# Patient Record
Sex: Male | Born: 1940 | Race: White | Hispanic: No | Marital: Single | Smoking: Former smoker
Health system: Southern US, Community
[De-identification: ages and names within clinical notes are randomized; demographics above are authoritative.]

## PROBLEM LIST (undated history)

## (undated) DIAGNOSIS — M1990 Unspecified osteoarthritis, unspecified site: Secondary | ICD-10-CM

## (undated) DIAGNOSIS — I2510 Atherosclerotic heart disease of native coronary artery without angina pectoris: Secondary | ICD-10-CM

## (undated) DIAGNOSIS — N400 Enlarged prostate without lower urinary tract symptoms: Secondary | ICD-10-CM

## (undated) DIAGNOSIS — I219 Acute myocardial infarction, unspecified: Secondary | ICD-10-CM

## (undated) DIAGNOSIS — R42 Dizziness and giddiness: Secondary | ICD-10-CM

## (undated) DIAGNOSIS — I209 Angina pectoris, unspecified: Secondary | ICD-10-CM

## (undated) DIAGNOSIS — I739 Peripheral vascular disease, unspecified: Secondary | ICD-10-CM

## (undated) DIAGNOSIS — E119 Type 2 diabetes mellitus without complications: Secondary | ICD-10-CM

## (undated) DIAGNOSIS — I10 Essential (primary) hypertension: Secondary | ICD-10-CM

## (undated) DIAGNOSIS — E7800 Pure hypercholesterolemia, unspecified: Secondary | ICD-10-CM

## (undated) DIAGNOSIS — K219 Gastro-esophageal reflux disease without esophagitis: Secondary | ICD-10-CM

## (undated) DIAGNOSIS — G4730 Sleep apnea, unspecified: Secondary | ICD-10-CM

## (undated) HISTORY — PX: CORONARY ARTERY BYPASS GRAFT: SHX141

## (undated) HISTORY — PX: OTHER SURGICAL HISTORY: SHX169

## (undated) HISTORY — PX: APPENDECTOMY: SHX54

## (undated) HISTORY — PX: CORONARY ANGIOPLASTY: SHX604

---

## 2015-03-01 ENCOUNTER — Other Ambulatory Visit: Payer: Self-pay | Admitting: Podiatry

## 2015-03-04 ENCOUNTER — Ambulatory Visit (HOSPITAL_COMMUNITY)
Admission: EM | Admit: 2015-03-04 | Discharge: 2015-03-04 | Disposition: A | Discharge status: Home / Self Care | Payer: Medicare Other | Source: Ambulatory Visit | Attending: Podiatry | Admitting: Podiatry

## 2015-03-04 ENCOUNTER — Encounter (HOSPITAL_COMMUNITY): Payer: Self-pay

## 2015-03-04 ENCOUNTER — Ambulatory Visit (HOSPITAL_COMMUNITY): Payer: Medicare Other

## 2015-03-04 ENCOUNTER — Encounter (HOSPITAL_COMMUNITY)
Admission: EM | Disposition: A | Discharge status: Home / Self Care | Payer: Self-pay | Source: Ambulatory Visit | Attending: Podiatry

## 2015-03-04 ENCOUNTER — Ambulatory Visit (HOSPITAL_COMMUNITY): Payer: Medicare Other | Admitting: Anesthesiology

## 2015-03-04 DIAGNOSIS — M868X7 Other osteomyelitis, ankle and foot: Secondary | ICD-10-CM | POA: Diagnosis not present

## 2015-03-04 DIAGNOSIS — M86172 Other acute osteomyelitis, left ankle and foot: Secondary | ICD-10-CM

## 2015-03-04 DIAGNOSIS — Z9889 Other specified postprocedural states: Secondary | ICD-10-CM

## 2015-03-04 DIAGNOSIS — M869 Osteomyelitis, unspecified: Secondary | ICD-10-CM

## 2015-03-04 HISTORY — DX: Type 2 diabetes mellitus without complications: E11.9

## 2015-03-04 HISTORY — DX: Peripheral vascular disease, unspecified: I73.9

## 2015-03-04 HISTORY — DX: Atherosclerotic heart disease of native coronary artery without angina pectoris: I25.10

## 2015-03-04 HISTORY — PX: AMPUTATION: SHX166

## 2015-03-04 HISTORY — DX: Acute myocardial infarction, unspecified: I21.9

## 2015-03-04 HISTORY — DX: Unspecified osteoarthritis, unspecified site: M19.90

## 2015-03-04 HISTORY — DX: Angina pectoris, unspecified: I20.9

## 2015-03-04 HISTORY — DX: Gastro-esophageal reflux disease without esophagitis: K21.9

## 2015-03-04 HISTORY — DX: Essential (primary) hypertension: I10

## 2015-03-04 LAB — CBC
HCT: 47.1 % (ref 39.0–52.0)
Hemoglobin: 16.3 g/dL (ref 13.0–17.0)
MCH: 31 pg (ref 26.0–34.0)
MCHC: 34.6 g/dL (ref 30.0–36.0)
MCV: 89.7 fL (ref 78.0–100.0)
PLATELETS: 341 10*3/uL (ref 150–400)
RBC: 5.25 MIL/uL (ref 4.22–5.81)
RDW: 12.9 % (ref 11.5–15.5)
WBC: 14 10*3/uL — AB (ref 4.0–10.5)

## 2015-03-04 LAB — BASIC METABOLIC PANEL
Anion gap: 9 (ref 5–15)
BUN: 16 mg/dL (ref 6–23)
CO2: 27 mmol/L (ref 19–32)
CREATININE: 0.75 mg/dL (ref 0.50–1.35)
Calcium: 9.1 mg/dL (ref 8.4–10.5)
Chloride: 103 mmol/L (ref 96–112)
GFR calc Af Amer: >90 mL/min (ref >90)
GFR calc non Af Amer: >90 mL/min (ref >90)
Glucose, Bld: 96 mg/dL (ref 70–99)
Potassium: 3.6 mmol/L (ref 3.5–5.1)
SODIUM: 139 mmol/L (ref 135–145)

## 2015-03-04 LAB — GLUCOSE, CAPILLARY
GLUCOSE-CAPILLARY: 71 mg/dL (ref 70–99)
GLUCOSE-CAPILLARY: 70 mg/dL (ref 70–99)
Glucose-Capillary: 78 mg/dL (ref 70–99)
Glucose-Capillary: 226 mg/dL — ABNORMAL HIGH (ref 70–99)
Glucose-Capillary: 253 mg/dL — ABNORMAL HIGH (ref 70–99)

## 2015-03-04 SURGERY — AMPUTATION DIGIT
Anesthesia: Monitor Anesthesia Care | Laterality: Left

## 2015-03-04 MED ORDER — LIDOCAINE HCL (PF) 1 % IJ SOLN
INTRAMUSCULAR | Status: AC
  Filled 2015-03-04: qty 30

## 2015-03-04 MED ORDER — MIDAZOLAM HCL 2 MG/2ML IJ SOLN
INTRAMUSCULAR | Status: AC
  Filled 2015-03-04: qty 2

## 2015-03-04 MED ORDER — DEXTROSE 50 % IV SOLN
12.5000 g | Freq: Once | INTRAVENOUS | Status: AC
  Administered 2015-03-04: 12.5 g via INTRAVENOUS

## 2015-03-04 MED ORDER — LIDOCAINE HCL (CARDIAC) 10 MG/ML IV SOLN
INTRAVENOUS | Status: DC | PRN
  Administered 2015-03-04: 50 mg via INTRAVENOUS

## 2015-03-04 MED ORDER — ONDANSETRON HCL 4 MG/2ML IJ SOLN: 4.0000 mg | Freq: Once | INTRAMUSCULAR | Status: AC | PRN

## 2015-03-04 MED ORDER — GLYCOPYRROLATE 0.2 MG/ML IJ SOLN
INTRAMUSCULAR | Status: AC
  Filled 2015-03-04: qty 1

## 2015-03-04 MED ORDER — LACTATED RINGERS IV SOLN
INTRAVENOUS | Status: DC
  Administered 2015-03-04: 11:00:00 via INTRAVENOUS

## 2015-03-04 MED ORDER — DEXTROSE 50 % IV SOLN
INTRAVENOUS | Status: DC | PRN
  Administered 2015-03-04: 25 g via INTRAVENOUS

## 2015-03-04 MED ORDER — PROPOFOL INFUSION 10 MG/ML OPTIME
INTRAVENOUS | Status: DC | PRN
  Administered 2015-03-04: 75 ug/kg/min via INTRAVENOUS

## 2015-03-04 MED ORDER — FENTANYL CITRATE 0.05 MG/ML IJ SOLN
INTRAMUSCULAR | Status: AC
  Filled 2015-03-04: qty 2

## 2015-03-04 MED ORDER — FENTANYL CITRATE 0.05 MG/ML IJ SOLN
INTRAMUSCULAR | Status: DC | PRN
  Administered 2015-03-04 (×4): 25 ug via INTRAVENOUS

## 2015-03-04 MED ORDER — SODIUM CHLORIDE 0.9 % IV SOLN
INTRAVENOUS | Status: DC | PRN
  Administered 2015-03-04: 12:00:00 via INTRAVENOUS

## 2015-03-04 MED ORDER — PROPOFOL 10 MG/ML IV BOLUS
INTRAVENOUS | Status: AC
  Filled 2015-03-04: qty 20

## 2015-03-04 MED ORDER — 0.9 % SODIUM CHLORIDE (POUR BTL) OPTIME
TOPICAL | Status: DC | PRN
  Administered 2015-03-04: 1000 mL

## 2015-03-04 MED ORDER — FENTANYL CITRATE 0.05 MG/ML IJ SOLN: 25.0000 ug | INTRAMUSCULAR | Status: DC | PRN

## 2015-03-04 MED ORDER — BUPIVACAINE HCL (PF) 0.5 % IJ SOLN
INTRAMUSCULAR | Status: DC | PRN
  Administered 2015-03-04: 10 mL

## 2015-03-04 MED ORDER — VANCOMYCIN HCL IN DEXTROSE 1-5 GM/200ML-% IV SOLN
INTRAVENOUS | Status: AC
  Filled 2015-03-04: qty 400

## 2015-03-04 MED ORDER — BUPIVACAINE HCL (PF) 0.5 % IJ SOLN
INTRAMUSCULAR | Status: AC
  Filled 2015-03-04: qty 30

## 2015-03-04 MED ORDER — GLYCOPYRROLATE 0.2 MG/ML IJ SOLN
INTRAMUSCULAR | Status: DC | PRN
  Administered 2015-03-04: 0.2 mg via INTRAVENOUS

## 2015-03-04 MED ORDER — MIDAZOLAM HCL 2 MG/2ML IJ SOLN
1.0000 mg | INTRAMUSCULAR | Status: DC | PRN
  Administered 2015-03-04: 2 mg via INTRAVENOUS

## 2015-03-04 MED ORDER — LIDOCAINE HCL (PF) 1 % IJ SOLN
INTRAMUSCULAR | Status: AC
  Filled 2015-03-04: qty 20

## 2015-03-04 MED ORDER — DEXTROSE 50 % IV SOLN
INTRAVENOUS | Status: AC
  Filled 2015-03-04: qty 50

## 2015-03-04 MED ORDER — MIDAZOLAM HCL 5 MG/5ML IJ SOLN
INTRAMUSCULAR | Status: DC | PRN
  Administered 2015-03-04 (×2): 1 mg via INTRAVENOUS

## 2015-03-04 MED ORDER — LACTATED RINGERS IV SOLN
INTRAVENOUS | Status: DC | PRN
  Administered 2015-03-04: 09:00:00 via INTRAVENOUS

## 2015-03-04 MED ORDER — SODIUM CHLORIDE 0.9 % IV SOLN
2000.0000 mg | Freq: Once | INTRAVENOUS | Status: AC
  Administered 2015-03-04: 2000 mg via INTRAVENOUS
  Filled 2015-03-04: qty 2000

## 2015-03-04 MED ORDER — FENTANYL CITRATE 0.05 MG/ML IJ SOLN
25.0000 ug | INTRAMUSCULAR | Status: AC
  Administered 2015-03-04 (×2): 25 ug via INTRAVENOUS

## 2015-03-04 SURGICAL SUPPLY — 42 items
BAG HAMPER (MISCELLANEOUS) ×2 IMPLANT
BANDAGE CONFORM 2  STR LF (GAUZE/BANDAGES/DRESSINGS) ×2 IMPLANT
BANDAGE ELASTIC 4 VELCRO NS (GAUZE/BANDAGES/DRESSINGS) ×2 IMPLANT
BANDAGE ESMARK 4X12 BL STRL LF (DISPOSABLE) ×1 IMPLANT
BANDAGE GAUZE ELAST BULKY 4 IN (GAUZE/BANDAGES/DRESSINGS) ×2 IMPLANT
BENZOIN TINCTURE PRP APPL 2/3 (GAUZE/BANDAGES/DRESSINGS) ×2 IMPLANT
BLADE 15 SAFETY STRL DISP (BLADE) ×4 IMPLANT
BLADE AVERAGE 25X9 (BLADE) IMPLANT
BNDG CONFORM 2 STRL LF (GAUZE/BANDAGES/DRESSINGS) ×2 IMPLANT
BNDG ESMARK 4X12 BLUE STRL LF (DISPOSABLE) ×2
BNDG GAUZE ELAST 4 BULKY (GAUZE/BANDAGES/DRESSINGS) ×2 IMPLANT
CLOTH BEACON ORANGE TIMEOUT ST (SAFETY) ×2 IMPLANT
COVER LIGHT HANDLE STERIS (MISCELLANEOUS) ×4 IMPLANT
CUFF TOURNIQUET SINGLE 18IN (TOURNIQUET CUFF) ×2 IMPLANT
DECANTER SPIKE VIAL GLASS SM (MISCELLANEOUS) ×2 IMPLANT
DRSG ADAPTIC 3X8 NADH LF (GAUZE/BANDAGES/DRESSINGS) ×2 IMPLANT
ELECT REM PT RETURN 9FT ADLT (ELECTROSURGICAL) ×2
ELECTRODE REM PT RTRN 9FT ADLT (ELECTROSURGICAL) ×1 IMPLANT
GAUZE SPONGE 4X4 12PLY STRL (GAUZE/BANDAGES/DRESSINGS) ×2 IMPLANT
GLOVE BIO SURGEON STRL SZ7.5 (GLOVE) ×4 IMPLANT
GLOVE BIOGEL PI IND STRL 7.0 (GLOVE) ×2 IMPLANT
GLOVE BIOGEL PI IND STRL 7.5 (GLOVE) ×1 IMPLANT
GLOVE BIOGEL PI INDICATOR 7.0 (GLOVE) ×2
GLOVE BIOGEL PI INDICATOR 7.5 (GLOVE) ×1
GLOVE ECLIPSE 6.5 STRL STRAW (GLOVE) ×2 IMPLANT
GOWN STRL REUS W/TWL LRG LVL3 (GOWN DISPOSABLE) ×6 IMPLANT
KIT ROOM TURNOVER APOR (KITS) ×2 IMPLANT
MANIFOLD NEPTUNE II (INSTRUMENTS) ×2 IMPLANT
NEEDLE HYPO 27GX1-1/4 (NEEDLE) ×4 IMPLANT
NS IRRIG 1000ML POUR BTL (IV SOLUTION) ×2 IMPLANT
PACK BASIC LIMB (CUSTOM PROCEDURE TRAY) ×2 IMPLANT
PAD ARMBOARD 7.5X6 YLW CONV (MISCELLANEOUS) ×2 IMPLANT
RASP SM TEAR CROSS CUT (RASP) IMPLANT
SET BASIN LINEN APH (SET/KITS/TRAYS/PACK) ×2 IMPLANT
SOL PREP PROV IODINE SCRUB 4OZ (MISCELLANEOUS) ×2 IMPLANT
SPONGE GAUZE 4X4 12PLY (GAUZE/BANDAGES/DRESSINGS) ×2 IMPLANT
SPONGE LAP 18X18 X RAY DECT (DISPOSABLE) ×2 IMPLANT
STRIP CLOSURE SKIN 1/2X4 (GAUZE/BANDAGES/DRESSINGS) ×2 IMPLANT
SUT ETHILON 4 0 PS 2 18 (SUTURE) IMPLANT
SUT PROLENE 4 0 PS 2 18 (SUTURE) ×4 IMPLANT
SUT VIC AB 4-0 PS2 27 (SUTURE) IMPLANT
SYRINGE 10CC LL (SYRINGE) ×4 IMPLANT

## 2015-03-04 NOTE — Anesthesia Postprocedure Evaluation (Signed)
  Anesthesia Post-op Note  Patient: Jeremiah Diaz  Procedure(s) Performed: Procedure(s): AMPUTATION DIGIT 3RD TOE LEFT FOOT (Left)  Patient Location: PACU  Anesthesia Type:MAC  Level of Consciousness: awake and alert   Airway and Oxygen Therapy: Patient Spontanous Breathing  Post-op Pain: mild  Post-op Assessment: Post-op Vital signs reviewed, Patient's Cardiovascular Status Stable, Respiratory Function Stable, Patent Airway, No signs of Nausea or vomiting and Adequate PO intake  Post-op Vital Signs: Reviewed and stable  Last Vitals:  Filed Vitals:   03/04/15 1400  BP: 141/67  Pulse: 76  Temp:   Resp: 13    Complications: Blood Gluocse 70 and treated with D 50. Will recheck Blood Glucose prior to discharge.

## 2015-03-04 NOTE — Addendum Note (Signed)
Addendum  created 03/04/15 1436 by Franco Noneseresa S Tomesha Sargent, CRNA   Modules edited: Anesthesia Events, Anesthesia Medication Administration, Narrator, Notes Section   Narrator:  Narrator: Event Log Edited   Notes Section:  File: [id number]

## 2015-03-04 NOTE — H&P (Signed)
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  The various methods of treatment have been discussed with the patient.  No guarantees were given.  After consideration of risks, benefits and other options for treatment, the patient has consented to surgery.  I have reviewed the patients' chart and labs.    Patient Vitals for the past 24 hrs:  BP Temp src Pulse Resp SpO2 Height Weight  03/04/15 1115 137/74 mmHg - - (!) 0 95 % - -  03/04/15 1110 (!) 161/91 mmHg - - 19 99 % - -  03/04/15 1105 (!) 151/75 mmHg - - 15 99 % - -  03/04/15 1100 (!) 162/90 mmHg - - 12 98 % - -  03/04/15 1055 (!) 162/90 mmHg - - (!) 22 100 % - -  03/04/15 1050 (!) 151/83 mmHg - - (!) 8 100 % - -  03/04/15 1045 (!) 155/82 mmHg - - 11 (!) 78 % - -  03/04/15 1040 (!) 150/85 mmHg - - 18 99 % - -  03/04/15 1035 (!) 150/80 mmHg - - 19 99 % - -  03/04/15 1030 (!) 148/79 mmHg - - (!) 8 99 % - -  03/04/15 1025 (!) 149/74 mmHg - - 10 (!) 84 % - -  03/04/15 1020 (!) 146/73 mmHg - - 15 100 % - -  03/04/15 1015 (!) 154/81 mmHg - - (!) 0 100 % - -  03/04/15 1010 (!) 145/69 mmHg - - (!) 0 97 % - -  03/04/15 1005 (!) 145/75 mmHg - - (!) 0 98 % - -  03/04/15 1000 (!) 152/80 mmHg - - 12 99 % - -  03/04/15 0955 (!) 150/78 mmHg - - (!) 9 98 % - -  03/04/15 0950 (!) 156/77 mmHg - - 17 98 % - -  03/04/15 0945 (!) 157/77 mmHg - - 13 99 % - -  03/04/15 0944 (!) 157/77 mmHg - - (!) 0 100 % - -  03/04/15 0943 (!) 157/77 mmHg - - 15 100 % - -  03/04/15 0942 (!) 150/86 mmHg - - 12 99 % - -  03/04/15 0941 (!) 150/86 mmHg - - - - - -  03/04/15 0940 - - - - 100 % - -  03/04/15 [id number] - Oral 78 11 - 5\' 9"  (1.753 m) 195 lb (88.451 kg)    A history and physical examination was performed in my office.  The patient was reexamined.  There have been no changes to this history and physical examination.  Dallas SchimkeBenjamin Ivan Kazimir Hartnett,  DPM

## 2015-03-04 NOTE — Anesthesia Procedure Notes (Signed)
Procedure Name: MAC Date/Time: 03/04/2015 12:15 PM Performed by: Pernell DupreADAMS, Fairley Copher A Pre-anesthesia Checklist: Patient identified, Timeout performed, Emergency Drugs available, Suction available and Patient being monitored Oxygen Delivery Method: Simple face mask

## 2015-03-04 NOTE — Anesthesia Postprocedure Evaluation (Signed)
  Anesthesia Post-op Note  Patient: Jeremiah Diaz  Procedure(s) Performed: Procedure(s): AMPUTATION DIGIT 3RD TOE LEFT FOOT (Left)  Patient Location: PACU  Anesthesia Type:MAC  Level of Consciousness: awake, alert , oriented and patient cooperative  Airway and Oxygen Therapy: Patient Spontanous Breathing and Patient connected to nasal cannula oxygen  Post-op Pain: none  Post-op Assessment: Post-op Vital signs reviewed, Patient's Cardiovascular Status Stable, Respiratory Function Stable, Patent Airway, No signs of Nausea or vomiting, Pain level controlled and No headache  Post-op Vital Signs: Reviewed and stable  Last Vitals:  Filed Vitals:   03/04/15 1210  BP: 143/77  Pulse:   Temp:   Resp: 13    Complications: No apparent anesthesia complications

## 2015-03-04 NOTE — Discharge Instructions (Signed)
These instructions will give you an idea of what to expect after surgery and how to manage issues that may arise before your first post op office visit.  Pain Management Pain is best managed by staying ahead of it. If pain gets out of control, it is difficult to get it back under control. Local anesthesia that lasts 6-8 hours is used to numb the foot and decrease pain.  For the best pain control, take the pain medication as instructed for the first 2 days post op. On the third day pain medication can be taken as needed.   Post Op Nausea Nausea is common after surgery, so it is managed proactively.  If prescribed, use the prescribed nausea medication regularly for the first 2 days post op.  Bandages Do not worry if there is blood on the bandage. What looks like a lot of blood on the bandage is actually a small amount. Blood on the dressing spreads out as it is absorbed by the gauze, the same way a drop of water spreads out on a paper towel.  If the bandages feel wet or dry, stiff and uncomfortable, call the office during office hours and we will schedule a time for you to have the bandage changed.  Unless you are specifically told otherwise, we will do the first bandage change in the office.  Keep your bandage dry. If the bandage becomes wet or soiled, notify the office and we will schedule a time to change the bandage.  Activity It is best to spend most of the first 2 days after surgery lying down with the foot elevated above the level of your heart. You may put weight on your left foot while wearing the post-operative shoe.   You may only get up to go to the restroom.  Driving Do not drive until you are able to respond in an emergency (i.e. slam on the brakes). This usually occurs after the bone has healed - 6 to 8 weeks.  Call the Office If you have a fever over 101F.  If you have increasing pain after the initial post op pain has settled down.  If you have increasing redness,  swelling, or drainage.  If you have any questions or concerns.

## 2015-03-04 NOTE — Transfer of Care (Signed)
Immediate Anesthesia Transfer of Care Note  Patient: Jeremiah Diaz  Procedure(s) Performed: Procedure(s): AMPUTATION DIGIT 3RD TOE LEFT FOOT (Left)  Patient Location: PACU  Anesthesia Type:MAC  Level of Consciousness: awake, alert , oriented and patient cooperative  Airway & Oxygen Therapy: Patient Spontanous Breathing and Patient connected to nasal cannula oxygen  Post-op Assessment: Report given to RN and Post -op Vital signs reviewed and stable  Post vital signs: Reviewed and stable  Last Vitals:  Filed Vitals:   03/04/15 1210  BP: 143/77  Pulse:   Temp:   Resp: 13    Complications: No apparent anesthesia complications

## 2015-03-04 NOTE — Op Note (Signed)
OPERATIVE NOTE  DATE OF PROCEDURE:  03/04/2015  SURGEON:  Dallas SchimkeBenjamin Ivan Sherriann Szuch, DPM  OR STAFF:   Circulator: Lennox Pippinsebra P Dallas, RN; Galen Manilaanya J Smith, RN Relief Scrub: Donald Poreeborah A Blackwell, CST Scrub Person: Horatio PelMaggie P Henderson, CST Circulator Assistant: Lizabeth LeydenBetty M Ashley, RN.   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  POSTOPERATIVE DIAGNOSIS:  Same.  PROCEDURE:  Amputation of third toe of left foot.  ANESTHESIA:  Monitor Anesthesia Care.   HEMOSTASIS:  Pneumatic ankle tourniquet set at 250 mmHg.  ESTIMATED BLOOD LOSS:  Minimal (<5 cc).  MATERIALS USED:  None.  INJECTABLES:  0.5% Marcaine plain.  PATHOLOGY:  Third toe of left foot.  COMPLICATIONS:  None.  INDICATIONS:  Nonhealing ulceration of the third toe of the left foot with erosive changes of the distal phalanx on plain film radiography..  DESCRIPTION OF THE PROCEDURE:  The patient was brought to the operating room and placed on the operative table in the supine position.  A pneumatic ankle tourniquet was applied to the patient's ankle.  Following sedation, the surgical site was anesthetized with 0.5% Marcaine plain.  The foot was then prepped, scrubbed, and draped in the usual sterile technique.  The foot was elevated, exsanguinated and the pneumatic ankle tourniquet inflated to 250 mmHg.    Attention was directed to the third toe of the left foot.  2 converging semielliptical incisions were performed encompassing the toes circumferentially.  Dissection was continued deep down to level of the third metatarsophalangeal joint.  The joint was disarticulated.  The third toe was passed from the operative field and sent to pathology for evaluation.  The head of the third metatarsal bone was evaluated.  No erosive changes were noted.  The surgical field was irrigated with copious amounts of sterile irrigant.  The skin was reapproximated using 4-0 Prolene in a simple suture technique.  A sterile compressive dressing was  applied to the left foot.  The pneumatic ankle tourniquet was deflated and a prompt hyperemic response was noted to all remaining digits of the left foot.   The patient tolerated the procedure well.  The patient was then transferred to PACU with vital signs stable and vascular status intact to all toes of the operative foot.  Following a period of postoperative monitoring, the patient will be discharged home.

## 2015-03-04 NOTE — Anesthesia Preprocedure Evaluation (Addendum)
Anesthesia Evaluation  Patient identified by MRN, date of birth, ID band Patient awake    Reviewed: Allergy & Precautions, NPO status , Patient's Chart, lab work & pertinent test results  Airway Mallampati: II  TM Distance: >3 FB     Dental  (+) Edentulous Lower, Edentulous Upper   Pulmonary shortness of breath and with exertion, former smoker,  breath sounds clear to auscultation        Cardiovascular hypertension, Pt. on medications and Pt. on home beta blockers + angina with exertion + CAD, + Past MI, + CABG, + Peripheral Vascular Disease and + DOE Rhythm:Regular Rate:Normal     Neuro/Psych    GI/Hepatic GERD-  Controlled and Medicated,  Endo/Other  diabetes, Well Controlled, Type 2, Insulin Dependent  Renal/GU      Musculoskeletal   Abdominal   Peds  Hematology   Anesthesia Other Findings   Reproductive/Obstetrics                            Anesthesia Physical Anesthesia Plan  ASA: III  Anesthesia Plan: MAC   Post-op Pain Management:    Induction: Intravenous  Airway Management Planned: Simple Face Mask  Additional Equipment:   Intra-op Plan:   Post-operative Plan:   Informed Consent: I have reviewed the patients History and Physical, chart, labs and discussed the procedure including the risks, benefits and alternatives for the proposed anesthesia with the patient or authorized representative who has indicated his/her understanding and acceptance.     Plan Discussed with:   Anesthesia Plan Comments: (1/2 amp D50W for cbg=78 in preop. )       Anesthesia Quick Evaluation

## 2015-03-06 ENCOUNTER — Encounter (HOSPITAL_COMMUNITY): Payer: Self-pay | Admitting: Podiatry

## 2015-04-30 ENCOUNTER — Other Ambulatory Visit: Payer: Self-pay | Admitting: Podiatry

## 2015-04-30 NOTE — Patient Instructions (Signed)
    Jeremiah Diaz  04/30/2015    Your procedure is scheduled on 05/02/15.  [facility]  Call this number if you have problems the morning of surgery:  [phone]   Remember:  Do not eat food or drink liquids after midnight.  Take these medicines the morning of surgery with A SIP OF WATER: Enalapril, Prevacid and Metoprolol. Take only half of your dose of Novolin 70/30 insulin the night before your procedure.   Do not wear jewelry, make-up or nail polish.  Do not wear lotions, powders, or perfumes.  You may wear deodorant.  Do not shave 48 hours prior to surgery.  Men may shave face and neck.  Do not bring valuables to the hospital.  [facility] not [facility] for any belongings or valuables.  Contacts, dentures or bridgework may not be worn into surgery.  Leave your suitcase in the car.  After surgery it may be brought to your room.  For patients admitted to the hospital, discharge time will be determined by your treatment team.  Patients discharged the day of surgery will not be allowed to drive home.    Special instructions:  Shower with Hibiclens (CHG bath) the night before your surgery and the morning of surgery.  Please read over the following fact sheets that you were given. Anesthesia Post-op Instructions    PATIENT INSTRUCTIONS POST-ANESTHESIA  IMMEDIATELY FOLLOWING SURGERY:  Do not drive or operate machinery for the first twenty four hours after surgery.  Do not make any important decisions for twenty four hours after surgery or while taking narcotic pain medications or sedatives.  If you develop intractable nausea and vomiting or a severe headache please notify your doctor immediately.  FOLLOW-UP:  Please make an appointment with your surgeon as instructed. You do not need to follow up with anesthesia unless specifically instructed to do so.  WOUND CARE INSTRUCTIONS (if applicable):  Keep a dry clean dressing on the anesthesia/puncture  wound site if there is drainage.  Once the wound has quit draining you may leave it open to air.  Generally you should leave the bandage intact for twenty four hours unless there is drainage.  If the epidural site drains for more than 36-48 hours please call the anesthesia department.  QUESTIONS?:  Please feel free to call your physician or the hospital operator if you have any questions, and they will be happy to assist you.

## 2015-05-01 ENCOUNTER — Encounter (HOSPITAL_COMMUNITY)
Admission: RE | Admit: 2015-05-01 | Discharge: 2015-05-01 | Disposition: A | Discharge status: Home / Self Care | Payer: Medicare Other | Source: Ambulatory Visit | Attending: Podiatry | Admitting: Podiatry

## 2015-05-01 ENCOUNTER — Ambulatory Visit (HOSPITAL_COMMUNITY)
Admission: RE | Admit: 2015-05-01 | Discharge: 2015-05-01 | Disposition: A | Discharge status: Home / Self Care | Payer: Medicare Other | Source: Ambulatory Visit | Attending: Podiatry | Admitting: Podiatry

## 2015-05-01 ENCOUNTER — Encounter (HOSPITAL_COMMUNITY): Payer: Self-pay

## 2015-05-01 DIAGNOSIS — Z89422 Acquired absence of other left toe(s): Secondary | ICD-10-CM | POA: Diagnosis not present

## 2015-05-01 DIAGNOSIS — X58XXXA Exposure to other specified factors, initial encounter: Secondary | ICD-10-CM | POA: Diagnosis not present

## 2015-05-01 DIAGNOSIS — M7989 Other specified soft tissue disorders: Secondary | ICD-10-CM | POA: Insufficient documentation

## 2015-05-01 DIAGNOSIS — M86172 Other acute osteomyelitis, left ankle and foot: Secondary | ICD-10-CM

## 2015-05-01 DIAGNOSIS — L97529 Non-pressure chronic ulcer of other part of left foot with unspecified severity: Secondary | ICD-10-CM | POA: Insufficient documentation

## 2015-05-01 DIAGNOSIS — M869 Osteomyelitis, unspecified: Secondary | ICD-10-CM | POA: Insufficient documentation

## 2015-05-01 DIAGNOSIS — T8189XA Other complications of procedures, not elsewhere classified, initial encounter: Secondary | ICD-10-CM | POA: Insufficient documentation

## 2015-05-01 HISTORY — DX: Sleep apnea, unspecified: G47.30

## 2015-05-01 HISTORY — DX: Dizziness and giddiness: R42

## 2015-05-01 HISTORY — DX: Benign prostatic hyperplasia without lower urinary tract symptoms: N40.0

## 2015-05-01 HISTORY — DX: Pure hypercholesterolemia, unspecified: E78.00

## 2015-05-01 LAB — CBC WITH DIFFERENTIAL/PLATELET
BASOS PCT: 0 % (ref 0–1)
Basophils Absolute: 0.1 10*3/uL (ref 0.0–0.1)
EOS PCT: 1 % (ref 0–5)
Eosinophils Absolute: 0.2 10*3/uL (ref 0.0–0.7)
HCT: 44.1 % (ref 39.0–52.0)
Hemoglobin: 14.8 g/dL (ref 13.0–17.0)
LYMPHS ABS: 2.5 10*3/uL (ref 0.7–4.0)
Lymphocytes Relative: 22 % (ref 12–46)
MCH: 30.1 pg (ref 26.0–34.0)
MCHC: 33.6 g/dL (ref 30.0–36.0)
MCV: 89.8 fL (ref 78.0–100.0)
MONO ABS: 0.9 10*3/uL (ref 0.1–1.0)
MONOS PCT: 8 % (ref 3–12)
NEUTROS ABS: 8.1 10*3/uL — AB (ref 1.7–7.7)
Neutrophils Relative %: 69 % (ref 43–77)
Platelets: 375 10*3/uL (ref 150–400)
RBC: 4.91 MIL/uL (ref 4.22–5.81)
RDW: 12.7 % (ref 11.5–15.5)
WBC: 11.8 10*3/uL — ABNORMAL HIGH (ref 4.0–10.5)

## 2015-05-01 LAB — BASIC METABOLIC PANEL
Anion gap: 7 (ref 5–15)
BUN: 18 mg/dL (ref 6–20)
CO2: 26 mmol/L (ref 22–32)
CREATININE: 0.92 mg/dL (ref 0.61–1.24)
Calcium: 9.1 mg/dL (ref 8.9–10.3)
Chloride: 101 mmol/L (ref 101–111)
GFR calc Af Amer: >60 mL/min (ref >60)
GFR calc non Af Amer: >60 mL/min (ref >60)
Glucose, Bld: 234 mg/dL — ABNORMAL HIGH (ref 65–99)
Potassium: 5.5 mmol/L — ABNORMAL HIGH (ref 3.5–5.1)
Sodium: 134 mmol/L — ABNORMAL LOW (ref 135–145)

## 2015-05-01 MED ORDER — CYCLOPENTOLATE-PHENYLEPHRINE OP SOLN OPTIME - NO CHARGE
OPHTHALMIC | Status: AC
  Filled 2015-05-01: qty 2

## 2015-05-01 MED ORDER — PHENYLEPHRINE HCL 2.5 % OP SOLN
OPHTHALMIC | Status: AC
  Filled 2015-05-01: qty 15

## 2015-05-01 MED ORDER — TETRACAINE HCL 0.5 % OP SOLN
OPHTHALMIC | Status: AC
  Filled 2015-05-01: qty 2

## 2015-05-01 MED ORDER — LIDOCAINE HCL (PF) 1 % IJ SOLN
INTRAMUSCULAR | Status: AC
  Filled 2015-05-01: qty 2

## 2015-05-01 MED ORDER — NEOMYCIN-POLYMYXIN-DEXAMETH 3.5-10000-0.1 OP SUSP
OPHTHALMIC | Status: AC
  Filled 2015-05-01: qty 5

## 2015-05-01 MED ORDER — LIDOCAINE HCL 3.5 % OP GEL
OPHTHALMIC | Status: AC
  Filled 2015-05-01: qty 1

## 2015-05-01 NOTE — Progress Notes (Signed)
   05/01/15 1428  OBSTRUCTIVE SLEEP APNEA  Have you ever been diagnosed with sleep apnea through a sleep study? No  Do you snore loudly (loud enough to be heard through closed doors)?  0  Do you often feel tired, fatigued, or sleepy during the daytime? 0  Has anyone observed you stop breathing during your sleep? 0  Do you have, or are you being treated for high blood pressure? 1  BMI more than 35 kg/m2? 0  Age over 72 years old? 1  Neck circumference greater than 40 cm/16 inches? 1  Gender: 1  Obstructive Sleep Apnea Score 4

## 2015-05-01 NOTE — Pre-Procedure Instructions (Signed)
Patient in for PAT prior to surgery tomorrow 05/02/2015. States he has not stopped his plavix, and took it today. Dr Jayme CloudGonzalez notified and wants Dr Reynolds BowlLu notified. Dr Reynolds BowlLu notified that patient has not stopped his plavix and she states he does not need to hold it for surgery. No further orders given.

## 2015-05-02 ENCOUNTER — Encounter (HOSPITAL_COMMUNITY): Payer: Self-pay | Admitting: *Deleted

## 2015-05-02 ENCOUNTER — Ambulatory Visit (HOSPITAL_COMMUNITY): Payer: Medicare Other | Admitting: Anesthesiology

## 2015-05-02 ENCOUNTER — Encounter (HOSPITAL_COMMUNITY)
Admission: RE | Disposition: A | Discharge status: Home / Self Care | Payer: Self-pay | Source: Ambulatory Visit | Attending: Podiatry

## 2015-05-02 ENCOUNTER — Ambulatory Visit (HOSPITAL_COMMUNITY)
Admission: RE | Admit: 2015-05-02 | Discharge: 2015-05-02 | Disposition: A | Discharge status: Home / Self Care | Payer: Medicare Other | Source: Ambulatory Visit | Attending: Podiatry | Admitting: Podiatry

## 2015-05-02 DIAGNOSIS — M868X7 Other osteomyelitis, ankle and foot: Secondary | ICD-10-CM | POA: Diagnosis not present

## 2015-05-02 DIAGNOSIS — Z951 Presence of aortocoronary bypass graft: Secondary | ICD-10-CM | POA: Insufficient documentation

## 2015-05-02 DIAGNOSIS — G4730 Sleep apnea, unspecified: Secondary | ICD-10-CM | POA: Diagnosis not present

## 2015-05-02 DIAGNOSIS — I10 Essential (primary) hypertension: Secondary | ICD-10-CM | POA: Insufficient documentation

## 2015-05-02 DIAGNOSIS — I252 Old myocardial infarction: Secondary | ICD-10-CM | POA: Diagnosis not present

## 2015-05-02 DIAGNOSIS — Z794 Long term (current) use of insulin: Secondary | ICD-10-CM | POA: Diagnosis not present

## 2015-05-02 DIAGNOSIS — K219 Gastro-esophageal reflux disease without esophagitis: Secondary | ICD-10-CM | POA: Diagnosis not present

## 2015-05-02 DIAGNOSIS — I25119 Atherosclerotic heart disease of native coronary artery with unspecified angina pectoris: Secondary | ICD-10-CM | POA: Insufficient documentation

## 2015-05-02 DIAGNOSIS — Z87891 Personal history of nicotine dependence: Secondary | ICD-10-CM | POA: Insufficient documentation

## 2015-05-02 DIAGNOSIS — I739 Peripheral vascular disease, unspecified: Secondary | ICD-10-CM | POA: Insufficient documentation

## 2015-05-02 DIAGNOSIS — E119 Type 2 diabetes mellitus without complications: Secondary | ICD-10-CM | POA: Diagnosis not present

## 2015-05-02 HISTORY — PX: AMPUTATION: SHX166

## 2015-05-02 LAB — GLUCOSE, CAPILLARY: Glucose-Capillary: 106 mg/dL — ABNORMAL HIGH (ref 65–99)

## 2015-05-02 SURGERY — AMPUTATION DIGIT
Anesthesia: Monitor Anesthesia Care | Site: Toe | Laterality: Left

## 2015-05-02 MED ORDER — PROPOFOL INFUSION 10 MG/ML OPTIME
INTRAVENOUS | Status: DC | PRN
  Administered 2015-05-02: 75 ug/kg/min via INTRAVENOUS

## 2015-05-02 MED ORDER — MIDAZOLAM HCL 5 MG/5ML IJ SOLN
INTRAMUSCULAR | Status: DC | PRN
  Administered 2015-05-02: 1 mg via INTRAVENOUS

## 2015-05-02 MED ORDER — SODIUM CHLORIDE 0.9 % IV SOLN
INTRAVENOUS | Status: DC | PRN
  Administered 2015-05-02: 11:00:00 via INTRAVENOUS

## 2015-05-02 MED ORDER — ONDANSETRON HCL 4 MG/2ML IJ SOLN: 4.0000 mg | Freq: Once | INTRAMUSCULAR | Status: DC | PRN

## 2015-05-02 MED ORDER — LACTATED RINGERS IV SOLN
INTRAVENOUS | Status: DC
  Administered 2015-05-02: 1000 mL via INTRAVENOUS

## 2015-05-02 MED ORDER — ONDANSETRON HCL 4 MG/2ML IJ SOLN
4.0000 mg | Freq: Once | INTRAMUSCULAR | Status: AC
  Administered 2015-05-02: 4 mg via INTRAVENOUS

## 2015-05-02 MED ORDER — FENTANYL CITRATE (PF) 100 MCG/2ML IJ SOLN
25.0000 ug | INTRAMUSCULAR | Status: AC
  Administered 2015-05-02 (×2): 25 ug via INTRAVENOUS

## 2015-05-02 MED ORDER — LIDOCAINE HCL 1 % IJ SOLN
INTRAMUSCULAR | Status: DC | PRN
  Administered 2015-05-02: 10 mL

## 2015-05-02 MED ORDER — FENTANYL CITRATE (PF) 100 MCG/2ML IJ SOLN
INTRAMUSCULAR | Status: AC
  Filled 2015-05-02: qty 2

## 2015-05-02 MED ORDER — VANCOMYCIN HCL IN DEXTROSE 1-5 GM/200ML-% IV SOLN
INTRAVENOUS | Status: AC
  Filled 2015-05-02: qty 200

## 2015-05-02 MED ORDER — LIDOCAINE HCL (PF) 1 % IJ SOLN
INTRAMUSCULAR | Status: AC
  Filled 2015-05-02: qty 30

## 2015-05-02 MED ORDER — VANCOMYCIN HCL 10 G IV SOLR
2000.0000 mg | Freq: Once | INTRAVENOUS | Status: AC
  Administered 2015-05-02: 2000 mg via INTRAVENOUS
  Filled 2015-05-02: qty 500

## 2015-05-02 MED ORDER — MIDAZOLAM HCL 2 MG/2ML IJ SOLN
1.0000 mg | INTRAMUSCULAR | Status: DC | PRN
  Administered 2015-05-02 (×2): 2 mg via INTRAVENOUS
  Filled 2015-05-02: qty 2

## 2015-05-02 MED ORDER — BUPIVACAINE HCL (PF) 0.25 % IJ SOLN
INTRAMUSCULAR | Status: AC
  Filled 2015-05-02: qty 30

## 2015-05-02 MED ORDER — FENTANYL CITRATE (PF) 100 MCG/2ML IJ SOLN: 25.0000 ug | INTRAMUSCULAR | Status: DC | PRN

## 2015-05-02 MED ORDER — MIDAZOLAM HCL 2 MG/2ML IJ SOLN
INTRAMUSCULAR | Status: AC
  Filled 2015-05-02: qty 2

## 2015-05-02 MED ORDER — ONDANSETRON HCL 4 MG/2ML IJ SOLN
INTRAMUSCULAR | Status: AC
  Filled 2015-05-02: qty 2

## 2015-05-02 MED ORDER — FENTANYL CITRATE (PF) 100 MCG/2ML IJ SOLN
INTRAMUSCULAR | Status: DC | PRN
  Administered 2015-05-02: 25 ug via INTRAVENOUS

## 2015-05-02 MED ORDER — PROPOFOL 10 MG/ML IV BOLUS
INTRAVENOUS | Status: AC
  Filled 2015-05-02: qty 20

## 2015-05-02 MED ORDER — 0.9 % SODIUM CHLORIDE (POUR BTL) OPTIME
TOPICAL | Status: DC | PRN
  Administered 2015-05-02: 800 mL

## 2015-05-02 SURGICAL SUPPLY — 42 items
BAG HAMPER (MISCELLANEOUS) ×3 IMPLANT
BANDAGE ELASTIC 4 VELCRO NS (GAUZE/BANDAGES/DRESSINGS) ×3 IMPLANT
BANDAGE ESMARK 4X12 BL STRL LF (DISPOSABLE) ×1 IMPLANT
BENZOIN TINCTURE PRP APPL 2/3 (GAUZE/BANDAGES/DRESSINGS) ×3 IMPLANT
BLADE SURG 15 STRL LF DISP TIS (BLADE) ×1 IMPLANT
BLADE SURG 15 STRL SS (BLADE) ×2
BNDG ESMARK 4X12 BLUE STRL LF (DISPOSABLE) ×3
BNDG GAUZE ELAST 4 BULKY (GAUZE/BANDAGES/DRESSINGS) ×3 IMPLANT
CLOSURE WOUND 1/4 X3 (GAUZE/BANDAGES/DRESSINGS) ×1
CLOTH BEACON ORANGE TIMEOUT ST (SAFETY) ×3 IMPLANT
COVER LIGHT HANDLE STERIS (MISCELLANEOUS) ×3 IMPLANT
CUFF TOURNIQUET SINGLE 18IN (TOURNIQUET CUFF) ×3 IMPLANT
DRAPE ORTHO 2.5IN SPLIT 77X108 (DRAPES) IMPLANT
DRAPE ORTHO SPLIT 77X108 STRL (DRAPES)
DRSG ADAPTIC 3X8 NADH LF (GAUZE/BANDAGES/DRESSINGS) ×3 IMPLANT
ELECT REM PT RETURN 9FT ADLT (ELECTROSURGICAL) ×3
ELECTRODE REM PT RTRN 9FT ADLT (ELECTROSURGICAL) ×1 IMPLANT
FORMALIN 10 PREFIL 120ML (MISCELLANEOUS) ×3 IMPLANT
GAUZE SPONGE 4X4 12PLY STRL (GAUZE/BANDAGES/DRESSINGS) ×2 IMPLANT
GLOVE BIO SURGEON STRL SZ 6 (GLOVE) ×3 IMPLANT
GLOVE BIOGEL PI IND STRL 6 (GLOVE) ×1 IMPLANT
GLOVE BIOGEL PI IND STRL 6.5 (GLOVE) ×1 IMPLANT
GLOVE BIOGEL PI INDICATOR 6 (GLOVE) ×2
GLOVE BIOGEL PI INDICATOR 6.5 (GLOVE) ×2
GLOVE ECLIPSE 6.5 STRL STRAW (GLOVE) ×3 IMPLANT
GLOVE INDICATOR 7.0 STRL GRN (GLOVE) ×6 IMPLANT
GOWN STRL REUS W/TWL LRG LVL3 (GOWN DISPOSABLE) ×3 IMPLANT
KIT ROOM TURNOVER APOR (KITS) ×3 IMPLANT
MANIFOLD NEPTUNE II (INSTRUMENTS) ×3 IMPLANT
NEEDLE HYPO 27GX1-1/4 (NEEDLE) ×6 IMPLANT
NS IRRIG 1000ML POUR BTL (IV SOLUTION) ×3 IMPLANT
PACK BASIC LIMB (CUSTOM PROCEDURE TRAY) ×3 IMPLANT
PAD ARMBOARD 7.5X6 YLW CONV (MISCELLANEOUS) ×3 IMPLANT
SET BASIN LINEN APH (SET/KITS/TRAYS/PACK) ×3 IMPLANT
SPONGE GAUZE 4X4 12PLY (GAUZE/BANDAGES/DRESSINGS) ×3 IMPLANT
STRIP CLOSURE SKIN 1/4X3 (GAUZE/BANDAGES/DRESSINGS) ×2 IMPLANT
SUT ETHILON 3 0 FSL (SUTURE) ×3 IMPLANT
SUT ETHILON 4 0 PS 2 18 (SUTURE) IMPLANT
SUT VIC AB 4-0 PS2 27 (SUTURE) IMPLANT
SUT VICRYL AB 3-0 FS1 BRD 27IN (SUTURE) IMPLANT
SYR CONTROL 10ML LL (SYRINGE) ×3 IMPLANT
TOWEL OR 17X26 4PK STRL BLUE (TOWEL DISPOSABLE) IMPLANT

## 2015-05-02 NOTE — Discharge Instructions (Signed)
Please keep dressing clean, dry, intact and do not get wet.    Ice and elevate surgical extremity on top of foot or behind the knee for 30 minutes on and 30 minutes off.  Non weight bear to left foot with aid of surgical shoe and walker.  Decrease weight bearing activity for only bathroom privileges.  Wear surgical shoe when going to the bathroom otherwise surgical shoe should be off if resting on chair/bed.      PATIENT INSTRUCTIONS POST-ANESTHESIA  IMMEDIATELY FOLLOWING SURGERY:  Do not drive or operate machinery for the first twenty four hours after surgery.  Do not make any important decisions for twenty four hours after surgery or while taking narcotic pain medications or sedatives.  If you develop intractable nausea and vomiting or a severe headache please notify your doctor immediately.  FOLLOW-UP:  Please make an appointment with your surgeon as instructed. You do not need to follow up with anesthesia unless specifically instructed to do so.  WOUND CARE INSTRUCTIONS (if applicable):  Keep a dry clean dressing on the anesthesia/puncture wound site if there is drainage.  Once the wound has quit draining you may leave it open to air.  Generally you should leave the bandage intact for twenty four hours unless there is drainage.  If the epidural site drains for more than 36-48 hours please call the anesthesia department.  QUESTIONS?:  Please feel free to call your physician or the hospital operator if you have any questions, and they will be happy to assist you.

## 2015-05-02 NOTE — Op Note (Signed)
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  A pneumatic ankle tourniquet was applied to the patient's ankle.  Following sedation, the surgical site was anesthetized with 10 mL of one-to-one mix of 1% lidocaine plain and 0.25% Marcaine plain.  The foot was then prepped, scrubbed, and draped in the usual sterile technique.  The foot was elevated, exsanguinated and the pneumatic ankle tourniquet inflated to 250 mmHg.    Attention was then directed to the left second toe where an ulceration is noted to the distal tip of the toe with the distal phalanx exposed. The left second toe skin was macerated along with purpuric discoloration and nonviable tissue with ulceration. A fishmouth incision was made from skin to bone with an attempt to preserve as much of the healthy skin tissue as possible. The left second digit was disarticulated at the metatarsophalangeal joint, removed the operative field, and sent to pathology. The second metatarsal head appears healthy and viable.  The wound was irrigated with copious amounts of normal sterile saline. The skin was then reapproximated using 3-0 nylon with vertical mattress and simple interrupted suture techniques. Steri-Strips were placed across the incision. The incision was then dressed with  Betadine soaked Adaptic, 4 x 4's, Kerlix, and Ace bandage. The pneumatic ankle tourniquet was then deflated and a prompt hyperemic response was noted to all remaining digits of the left foot. The patient tolerated the procedure and anesthesia well. He was transferred to the PACU with vital signs stable and vascular status at preoperative baseline to all remaining toes of the left foot. Following a period of postoperative monitoring the patient will be discharged home on written and oral postoperative instructions.

## 2015-05-02 NOTE — Anesthesia Postprocedure Evaluation (Signed)
  Anesthesia Post-op Note  Patient: Jeremiah Diaz  Procedure(s) Performed: Procedure(s): AMPUTATION DIGIT 2ND TOE LEFT FOOT (Left)  Patient Location: PACU  Anesthesia Type:MAC  Level of Consciousness: awake, alert , oriented and patient cooperative  Airway and Oxygen Therapy: Patient Spontanous Breathing  Post-op Pain: none  Post-op Assessment: Post-op Vital signs reviewed, Patient's Cardiovascular Status Stable, Respiratory Function Stable, Patent Airway, No signs of Nausea or vomiting and Pain level controlled  Post-op Vital Signs: Reviewed and stable  Last Vitals:  Filed Vitals:   05/02/15 1105  BP: 105/60  Temp:   Resp: 16    Complications: No apparent anesthesia complications

## 2015-05-02 NOTE — Anesthesia Preprocedure Evaluation (Signed)
Anesthesia Evaluation  Patient identified by MRN, date of birth, ID band Patient awake    Reviewed: Allergy & Precautions, NPO status , Patient's Chart, lab work & pertinent test results  Airway Mallampati: II  TM Distance: >3 FB     Dental  (+) Edentulous Lower, Edentulous Upper   Pulmonary shortness of breath and with exertion, sleep apnea , former smoker,  breath sounds clear to auscultation        Cardiovascular hypertension, Pt. on medications and Pt. on home beta blockers + angina with exertion + CAD, + Past MI, + CABG, + Peripheral Vascular Disease and + DOE Rhythm:Regular Rate:Normal     Neuro/Psych    GI/Hepatic GERD-  Controlled and Medicated,  Endo/Other  diabetes, Well Controlled, Type 2, Insulin Dependent  Renal/GU      Musculoskeletal   Abdominal   Peds  Hematology   Anesthesia Other Findings   Reproductive/Obstetrics                             Anesthesia Physical Anesthesia Plan  ASA: III  Anesthesia Plan: MAC   Post-op Pain Management:    Induction: Intravenous  Airway Management Planned: Simple Face Mask  Additional Equipment:   Intra-op Plan:   Post-operative Plan:   Informed Consent: I have reviewed the patients History and Physical, chart, labs and discussed the procedure including the risks, benefits and alternatives for the proposed anesthesia with the patient or authorized representative who has indicated his/her understanding and acceptance.     Plan Discussed with:   Anesthesia Plan Comments: (1/2 amp D50W for cbg=78 in preop. )        Anesthesia Quick Evaluation

## 2015-05-02 NOTE — Transfer of Care (Signed)
Immediate Anesthesia Transfer of Care Note  Patient: Jeremiah Diaz  Procedure(s) Performed: Procedure(s): AMPUTATION DIGIT 2ND TOE LEFT FOOT (Left)  Patient Location: PACU  Anesthesia Type:MAC  Level of Consciousness: awake, alert  and patient cooperative  Airway & Oxygen Therapy: Patient Spontanous Breathing and Patient connected to nasal cannula oxygen  Post-op Assessment: Report given to RN, Post -op Vital signs reviewed and stable and Patient moving all extremities  Post vital signs: Reviewed and stable  Last Vitals:  Filed Vitals:   05/02/15 1105  BP: 105/60  Temp:   Resp: 16    Complications: No apparent anesthesia complications

## 2015-05-02 NOTE — H&P (Signed)
HISTORY AND PHYSICAL INTERVAL NOTE:  05/02/2015  10:59 AM  Jeremiah Diaz  has presented today for surgery, with the diagnosis of osteomyelitis, for left 2nd toe amputation with possible wound VAC application.  The various methods of treatment have been discussed with the patient.  No guarantees were given.  After consideration of risks, benefits and other options for treatment, the patient has consented to surgery.  I have reviewed the patients' chart and labs.    Patient Vitals for the past 24 hrs:  BP Temp Temp src Resp SpO2 Height Weight  05/02/15 1025 129/70 mmHg - - 11 99 % - -  05/02/15 1020 123/65 mmHg - - 13 99 % - -  05/02/15 1015 137/76 mmHg 98.5 F (36.9 C) Oral (!) 28 100 % 5' 9.5" (1.765 m) 87.091 kg (192 lb)  05/02/15 1010 129/70 mmHg - - 19 99 % - -  05/02/15 1005 130/73 mmHg - - (!) 35 100 % - -    A history and physical examination was performed in my office.  The patient was reexamined.  There have been no changes to this history and physical examination.  Laurell JosephsSherry Aurielle Slingerland, DPM

## 2015-05-03 ENCOUNTER — Encounter (HOSPITAL_COMMUNITY): Payer: Self-pay | Admitting: Podiatry

## 2016-03-30 IMAGING — DX DG FOOT COMPLETE 3+V*L*
3 series · 3 of 3 positions shown · non-contrast
Comparison: Left foot MRI 04/26/2015 and radiographs 03/04/2015

CLINICAL DATA: Left foot osteomyelitis. New ulcer involving the
second toe 9 days ago. Third toe amputation 8 weeks ago.

EXAM:
LEFT FOOT - COMPLETE 3+ VIEW

[foot ap]
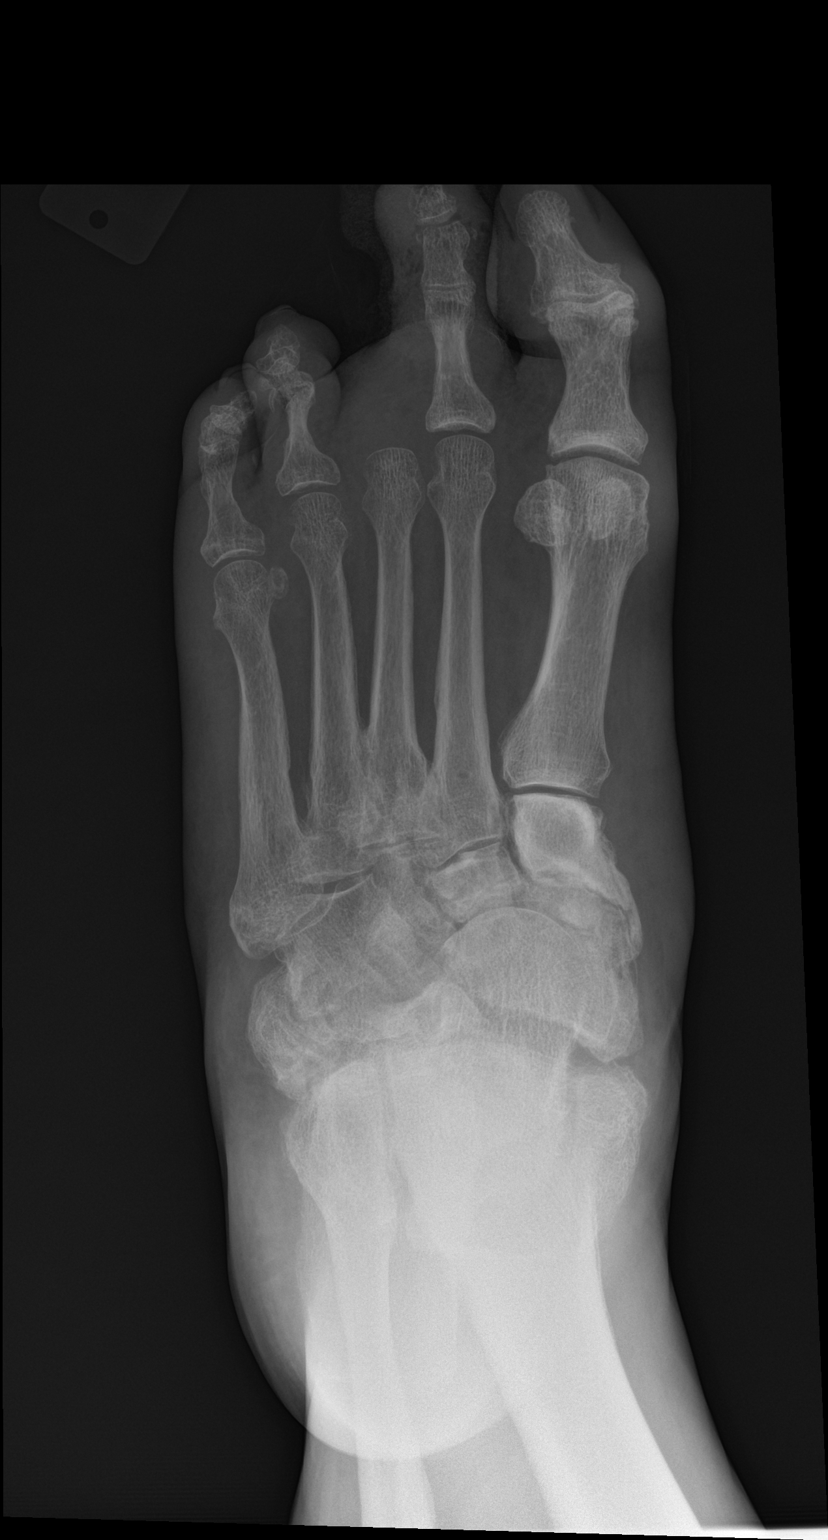

[foot obl]
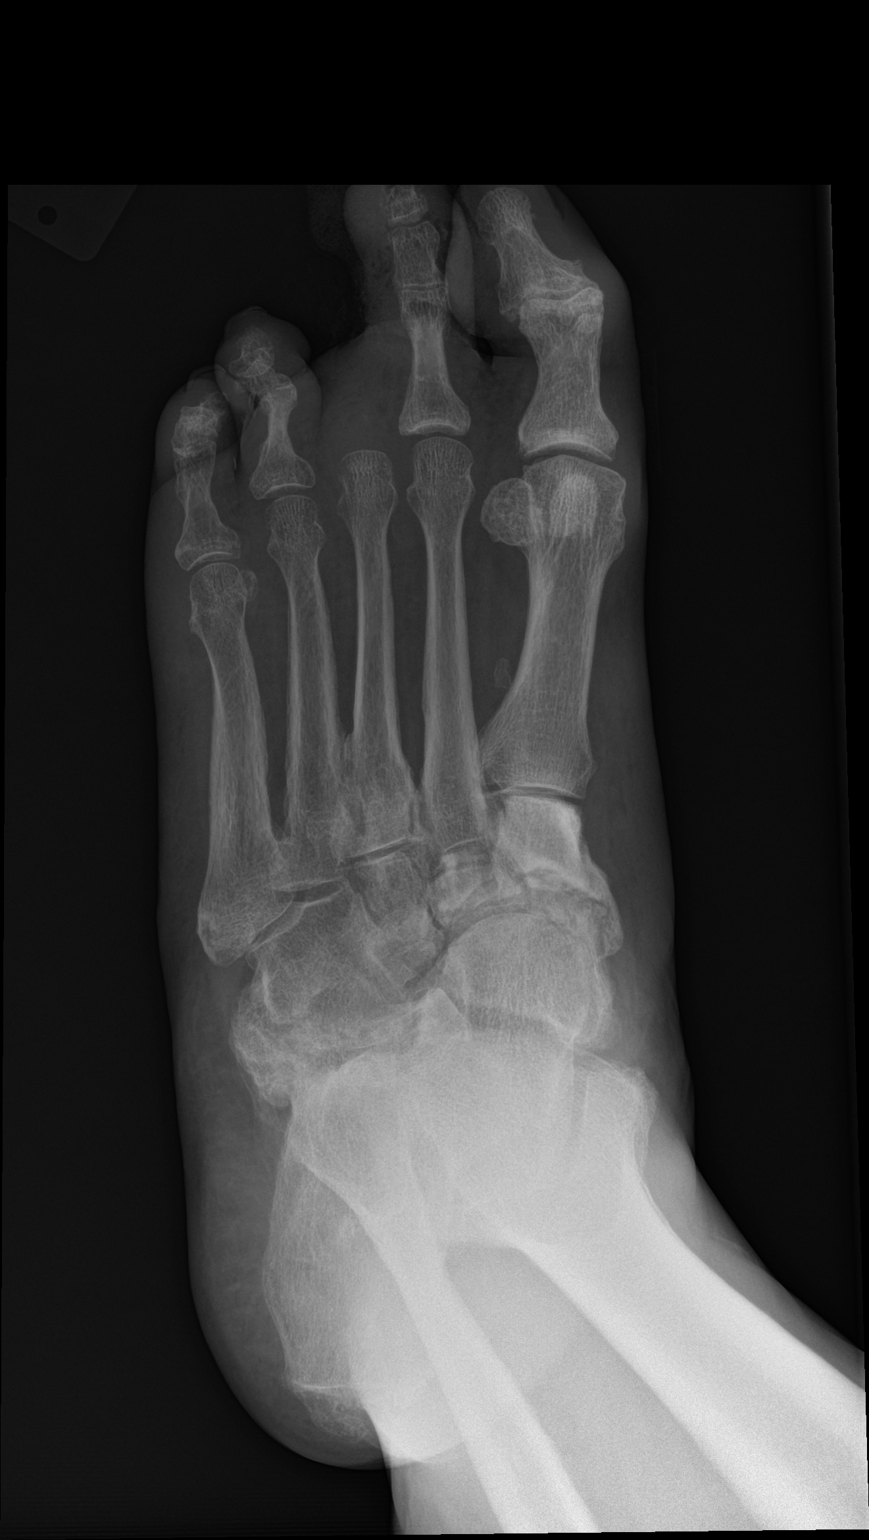

[foot lat]
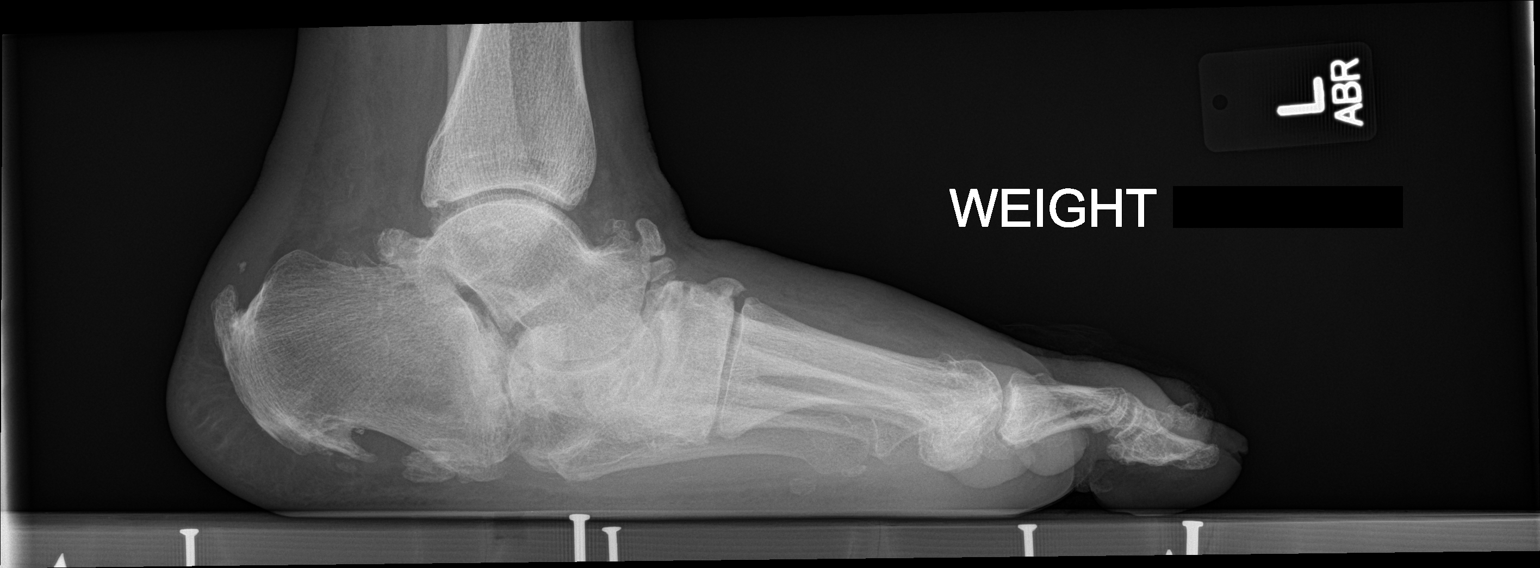

[3 of 3 positions shown; findings below may reference images not displayed]

FINDINGS: Sequelae of third toe amputation at the level of the MTP joint are
again identified. There is diffuse soft tissue swelling involving
the second toe, increased from prior radiographs. There is
ulceration of the distal soft tissues of the second toe with erosion
of the tuft of the distal phalanx which is new from the prior
radiographs. There may also be early erosive changes involving the
distal aspect of the middle phalanx. Bandage material overlies the
second toe. Chronic deformities of the fourth toe proximal and
middle phalanges are again seen. Chronic neuropathic arthropathy
involving the midfoot and hindfoot is again noted.
IMPRESSION: Evidence of progressive osteomyelitis involving the second toe
distal phalanx and possibly middle phalanx with increased soft
tissue swelling.

## 2016-07-23 ENCOUNTER — Other Ambulatory Visit: Payer: Self-pay | Admitting: Podiatry

## 2016-07-23 DIAGNOSIS — E08621 Diabetes mellitus due to underlying condition with foot ulcer: Secondary | ICD-10-CM

## 2016-07-23 DIAGNOSIS — L97509 Non-pressure chronic ulcer of other part of unspecified foot with unspecified severity: Principal | ICD-10-CM

## 2016-08-20 ENCOUNTER — Other Ambulatory Visit: Payer: Self-pay | Admitting: Interventional Radiology

## 2016-08-20 ENCOUNTER — Ambulatory Visit
Admission: RE | Admit: 2016-08-20 | Discharge: 2016-08-20 | Disposition: A | Discharge status: Home / Self Care | Payer: Medicare Other | Source: Ambulatory Visit | Attending: Podiatry | Admitting: Podiatry

## 2016-08-20 DIAGNOSIS — L97509 Non-pressure chronic ulcer of other part of unspecified foot with unspecified severity: Principal | ICD-10-CM

## 2016-08-20 DIAGNOSIS — E08621 Diabetes mellitus due to underlying condition with foot ulcer: Secondary | ICD-10-CM

## 2016-08-20 HISTORY — PX: IR GENERIC HISTORICAL: IMG1180011

## 2016-08-20 NOTE — Consult Note (Signed)
Chief Complaint: I have pain in my legs and have a wound.   Referring Physician(s): McKinney,Benjamin  History of Present Illness: Jeremiah Diaz is a 75 y.o. male is a 75 -year-old gentleman presenting as a scheduled consultation today at vascular and interventional radiology, kindly referred by Dr. Theola SequinIvan McKinney of [facility].   Jeremiah Diaz tells me he has been seeing Dr. Nolen MuMcKinney for at least 2 years for his foot care. He has a diagnosis of diabetic foot ulcer, left Charcot foot deformity, prior left second and third digit amputation, and wounds of his bilateral shin.    He tells me he does have symptoms of short distance claudication, less than 20 m, with pain in his thighs and calves. This has been more restrictive in the recent past.  He tells me that although he is not working at a daily job, he does help his friends and neighbors with labor occasionally.  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 He spent many days on his feet for long hours.  His cardiovascular risk factors include diabetes, tobacco use, hypertension, hyperlipidemia, known coronary disease.  He also has atrial fibrillation, for which she currently takes xarelto.  [facility]: [facility] associates: Dr Celine Mansesai Cardiology: Dr. Earna CoderZachary "Kidney Doctor": Dr. Nechama GuardBauer, [facility]  Medical history: Prior myocardial infarction 5 ([facility]), with prior PTCA and prior CABG, hypertension, diabetes (since age 75), hyperlipidemia. 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 He stopped at the time of his bypass. He is not married. He has no children. He tells me he is an orphan with 9 brothers and sisters, of which he has 3 remaining. He lives in [place].  Past Medical History:  Diagnosis Date    . Anginal pain (HCC)   . Arthritis   . BPH (benign prostatic hyperplasia)   . Coronary artery disease   . Diabetes mellitus without complication (HCC)   . GERD (gastroesophageal reflux disease)   . High cholesterol   . Hypertension   . Myocardial infarction (HCC)    x5  . Peripheral vascular disease (HCC)   . Sleep apnea    Stop Bang score of 4  . Vertigo     Past Surgical History:  Procedure Laterality Date  . AMPUTATION Left 03/04/2015   Procedure: AMPUTATION DIGIT 3RD TOE LEFT FOOT;  Surgeon: Ferman HammingBenjamin McKinney, DPM;  Location: [facility];  Service: Podiatry;  Laterality: Left;  . AMPUTATION Left 05/02/2015   Procedure: AMPUTATION DIGIT 2ND TOE LEFT FOOT;  Surgeon: Laurell JosephsSherry Lu, DPM;  Location: [facility];  Service: Podiatry;  Laterality: Left;  . APPENDECTOMY    . CORONARY ANGIOPLASTY    . CORONARY ARTERY BYPASS GRAFT    . OTHER SURGICAL HISTORY     cataract both eyes, steel removal from lt hand, cyst remmoved from back    Allergies: Erythromycin and Penicillins  Medications: Prior to Admission medications   Medication Sig Start Date End Date Taking? Authorizing Provider  amiodarone (PACERONE) 200 MG tablet Take 200 mg by mouth daily.   Yes Historical Provider, MD  aspirin 81 MG tablet Take 81 mg by mouth.   Yes Historical Provider, MD  cetirizine (ZYRTEC) 10 MG tablet Take 10 mg by mouth daily.   Yes Historical Provider, MD  diltiazem (CARDIZEM) 30 MG tablet Take 30 mg by mouth 4 (four) times daily.   Yes Historical Provider, MD  enalapril (VASOTEC) 20 MG tablet Take 20 mg by mouth daily.   Yes Historical Provider, MD  finasteride (PROSCAR) 5 MG tablet Take 5 mg by mouth daily.   Yes Historical Provider, MD  furosemide (LASIX) 20 MG tablet Take 20 mg by mouth daily.   Yes Historical Provider, MD  insulin NPH-regular Human (NOVOLIN 70/30) (70-30) 100 UNIT/ML injection Inject 60-64 Units into the skin 2 (two) times daily with a meal. 64 units in the morning and 60 units in the  evening.   Yes Historical Provider, MD  lansoprazole (PREVACID) 30 MG capsule Take 30 mg by mouth 2 (two) times daily before a meal.   Yes Historical Provider, MD  meclizine (ANTIVERT) 25 MG tablet Take 25 mg by mouth 3 (three) times daily as needed (inner ear).    Yes Historical Provider, MD  metoprolol succinate (TOPROL-XL) 50 MG 24 hr tablet Take 50 mg by mouth daily. Take with or immediately following a meal.   Yes Historical Provider, MD  rivaroxaban (XARELTO) 20 MG TABS tablet Take 20 mg by mouth daily with supper.   Yes Historical Provider, MD  simvastatin (ZOCOR) 40 MG tablet Take 40 mg by mouth daily at 6 PM.   Yes Historical Provider, MD  tamsulosin (FLOMAX) 0.4 MG CAPS capsule Take 0.4 mg by mouth daily.   Yes Historical Provider, MD     No family history on file.  Social History   Social History  . Marital status: Single    Spouse name: N/A  . Number of children: N/A  . Years of education: N/A   Social History Main Topics  . Smoking status: Former Smoker    Packs/day: 1.00    Years: 45.00    Types: Cigarettes    Quit date: 03/04/1999  . Smokeless tobacco: None  . Alcohol use Yes     Comment: occasional  . Drug use: No  . Sexual activity: No   Other Topics Concern  . None   Social History Narrative  . None    Review of Systems: A 12 point ROS discussed and pertinent positives are indicated in the HPI above.  All other systems are negative.  Review of Systems  Vital Signs: BP 122/70 (BP Location: Right Arm, Patient Position: Sitting, Cuff Size: Normal)   Pulse 95   Temp 97.6 F (36.4 C)   Resp 18   SpO2 99%   Physical Exam  Atraumatic, normocephalic, mucous membranes moist pink. Wearing glasses. Conjugate gaze. No scleral icterus or scleral injection. Alert and oriented to person place and time. Appropriate questions and affect. Moving all 4 extremities equally with gross motor intact. Clear to auscultation bilateral lungs. Arrhythmia is documented,  with no third heart sounds auscultated. No bruit at the left or right neck. Reduced pulses at the popliteal region. Non-palpable pedal pulses. Edema of the lower extremity, right greater than left with tense skin. He has to excoriation of the right lower extremity and one on the left calf. No sign of infection/inflammation. Healed amputation of the left second and third toe. No intertriginous wound at this time. Absence of hair of the bilateral lower extremity.  Imaging: No results found.  Labs:  CBC: No results for input(s): WBC, HGB, HCT, PLT in the last 8760 hours.  COAGS: No results for input(s): INR, APTT in the last 8760 hours.  BMP: No results for  input(s): NA, K, CL, CO2, GLUCOSE, BUN, CALCIUM, CREATININE, GFRNONAA, GFRAA in the last 8760 hours.  Invalid input(s): CMP  LIVER FUNCTION TESTS: No results for input(s): BILITOT, AST, ALT, ALKPHOS, PROT, ALBUMIN in the last 8760 hours.  TUMOR MARKERS: No results for input(s): AFPTM, CEA, CA199, CHROMGRNA in the last 8760 hours.  Assessment and Plan:  75 year old gentleman with multiple cardiovascular risk factors and symptoms of diabetic foot ulcer and related peripheral vascular disease. Currently he has a healed amputation of his left second and third toe, with wounds of his calves which may be multifactorial. He does complain of short distance claudication and questionable rest pain.  Recent noninvasive evaluation demonstrates abnormal PVR and waveform of the bilateral lower extremity, with abnormal bilateral resting ABI.   I agree with Dr. Nolen Mu in that he is a patient deserving a complete workup for peripheral artery disease. I think it is reasonable to start with CT angiogram, as he is someone who has a high likelihood of multisegment disease, and CT would be very useful for procedure planning.  At this time he has significant lower extremity edema, and although it is most likely that this is secondary to dependent  edema and his heart disease, I feel it is worth evaluating with a duplex exam. If he does have DVT while on Xarelto, a filter may be appropriate.  At this time we will schedule CT angiogram with runoff of the lower extremities as well as bilateral lower extremity duplex evaluation. We will follow-up with Jeremiah. Downard once these are performed.    He understands and agrees with our care plan.  Thank you for this interesting consult.  I greatly enjoyed meeting Jeremiah Diaz and look forward to participating in their care.  A copy of this report was sent to the requesting provider on this date.  Electronically Signed: Gilmer Mor 08/20/2016, 5:07 PM   I spent a total of  40 Minutes   in face to face in clinical consultation, greater than 50% of which was counseling/coordinating care for diabetic foot ulcer, peripheral artery disease, possible angiogram and revascularization.

## 2016-08-24 ENCOUNTER — Other Ambulatory Visit: Payer: Self-pay | Admitting: *Deleted

## 2016-08-24 DIAGNOSIS — L97509 Non-pressure chronic ulcer of other part of unspecified foot with unspecified severity: Principal | ICD-10-CM

## 2016-08-24 DIAGNOSIS — E08621 Diabetes mellitus due to underlying condition with foot ulcer: Secondary | ICD-10-CM

## 2016-09-11 ENCOUNTER — Ambulatory Visit (HOSPITAL_COMMUNITY): Payer: Medicare Other

## 2016-12-10 ENCOUNTER — Encounter: Payer: Self-pay | Admitting: Interventional Radiology
